# Patient Record
Sex: Male | Born: 1940 | Race: White | Hispanic: No | Smoking: Former smoker
Health system: Southern US, Community
[De-identification: ages and names within clinical notes are randomized; demographics above are authoritative.]

## PROBLEM LIST (undated history)

## (undated) DIAGNOSIS — E7800 Pure hypercholesterolemia, unspecified: Secondary | ICD-10-CM

## (undated) DIAGNOSIS — I10 Essential (primary) hypertension: Secondary | ICD-10-CM

## (undated) DIAGNOSIS — J302 Other seasonal allergic rhinitis: Secondary | ICD-10-CM

## (undated) DIAGNOSIS — G4730 Sleep apnea, unspecified: Secondary | ICD-10-CM

## (undated) DIAGNOSIS — J189 Pneumonia, unspecified organism: Secondary | ICD-10-CM

## (undated) DIAGNOSIS — J45909 Unspecified asthma, uncomplicated: Secondary | ICD-10-CM

## (undated) DIAGNOSIS — R0602 Shortness of breath: Secondary | ICD-10-CM

## (undated) HISTORY — DX: Unspecified asthma, uncomplicated: J45.909

## (undated) HISTORY — DX: Shortness of breath: R06.02

## (undated) HISTORY — DX: Pure hypercholesterolemia, unspecified: E78.00

## (undated) HISTORY — DX: Other seasonal allergic rhinitis: J30.2

## (undated) HISTORY — DX: Essential (primary) hypertension: I10

---

## 1975-10-06 DIAGNOSIS — J189 Pneumonia, unspecified organism: Secondary | ICD-10-CM

## 1975-10-06 HISTORY — DX: Pneumonia, unspecified organism: J18.9

## 2004-12-13 ENCOUNTER — Inpatient Hospital Stay: Payer: Self-pay | Admitting: Anesthesiology

## 2005-01-23 ENCOUNTER — Ambulatory Visit: Payer: Self-pay | Admitting: Internal Medicine

## 2005-10-05 HISTORY — PX: SKIN CANCER EXCISION: SHX779

## 2008-07-02 ENCOUNTER — Ambulatory Visit (HOSPITAL_BASED_OUTPATIENT_CLINIC_OR_DEPARTMENT_OTHER): Admission: RE | Admit: 2008-07-02 | Discharge: 2008-07-02 | Payer: Self-pay | Admitting: Specialist

## 2008-07-02 ENCOUNTER — Encounter (INDEPENDENT_AMBULATORY_CARE_PROVIDER_SITE_OTHER): Payer: Self-pay | Admitting: Specialist

## 2009-01-17 ENCOUNTER — Encounter: Admission: RE | Admit: 2009-01-17 | Discharge: 2009-01-17 | Payer: Self-pay | Admitting: Family Medicine

## 2011-02-17 NOTE — Op Note (Signed)
NAMEWENZEL, BACKLUND               ACCOUNT NO.:  [id number]   MEDICAL RECORD NO.:  [id number]          PATIENT TYPE:  AMB   LOCATION:  DSC                          FACILITY:  [facility]   PHYSICIAN:  Earvin Hansen L. Truesdale, M.D.DATE OF BIRTH:  1941/04/22   DATE OF PROCEDURE:  07/02/2008  DATE OF DISCHARGE:                               OPERATIVE REPORT   INDICATIONS FOR PROCEDURE:  This is a 70 year old gentleman who with  previous history of squamous cell carcinoma and basal cell carcinoma of  the body has a nonhealing area involving the right parietal scalp area  measuring approximately 1 x 1.5 cm.  Previous biopsies showed basal cell  carcinoma because of the fusiform appearance.  We are going to do wide  excision of the area and reconstruct with full-thickness skin graft  taken from the right groin.   ANESTHESIA:  Local 1.5% with epinephrine 1:200,000 concentration, a  total of 20 mL for the right scalp and the right groin.   PROCEDURE IN DETAIL:  Prep was done with Zephiran solution and Betadine,  walled off with sterile towels and drapes so as to make a sterile field.  Previous marks had been made for the margins.  Excision was made  clearing the margins down to underlying deep subcutaneous tissue on the  right scalp.  Subcutaneous bleeding was controlled with the Bovie  anticoagulation.  After proper hemostasis, saline gauze was placed.  Next, a full-thickness skin graft was taken from the right groin crease  line in an elliptical appearance down to the underlying deep  subcutaneous tissue.  Hemostasis was maintained with the Bovie  anticoagulation.  Next, the wound was closed in layers with 3-0 Monocryl  deep subcutaneously x2 and then a running subcuticular stitch of 3-0  Monocryl.  Steri-Strips and soft dressings were applied on the areas.  Next, the full-thickness skin graft was defatted and placed on the  defect and sutured with 4-0 Prolene running.  A run suture was left long  to make that a straight dressing and tied over with cotton and Xeroform  gauze and Ace wrap.  He tolerated all the procedures very well and was  taken to recovery in good condition.      Yaakov Guthrie. Shon Hough, M.D.  Electronically Signed     GLT/MEDQ  D:  07/02/2008  T:  07/02/2008  Job:  [id number]

## 2014-03-01 ENCOUNTER — Encounter (INDEPENDENT_AMBULATORY_CARE_PROVIDER_SITE_OTHER): Payer: Self-pay

## 2014-03-01 ENCOUNTER — Ambulatory Visit (INDEPENDENT_AMBULATORY_CARE_PROVIDER_SITE_OTHER): Payer: Medicare Other | Admitting: Internal Medicine

## 2014-03-01 ENCOUNTER — Encounter: Payer: Self-pay | Admitting: Internal Medicine

## 2014-03-01 VITALS — BP 102/68 | HR 86 | Ht 69.0 in | Wt 349.0 lb

## 2014-03-01 DIAGNOSIS — J441 Chronic obstructive pulmonary disease with (acute) exacerbation: Secondary | ICD-10-CM

## 2014-03-01 DIAGNOSIS — J449 Chronic obstructive pulmonary disease, unspecified: Secondary | ICD-10-CM

## 2014-03-01 DIAGNOSIS — J45901 Unspecified asthma with (acute) exacerbation: Secondary | ICD-10-CM

## 2014-03-01 DIAGNOSIS — G4733 Obstructive sleep apnea (adult) (pediatric): Secondary | ICD-10-CM

## 2014-03-01 DIAGNOSIS — J209 Acute bronchitis, unspecified: Secondary | ICD-10-CM

## 2014-03-01 MED ORDER — MOMETASONE FURO-FORMOTEROL FUM 200-5 MCG/ACT IN AERO: 2.0000 | INHALATION_SPRAY | Freq: Two times a day (BID) | RESPIRATORY_TRACT | Status: DC

## 2014-03-01 NOTE — Progress Notes (Signed)
03/01/14- 73 yoM former smoker(2PPD x 30 yrs) Referred by Dr. Sigmund Hazel for wheezing.   He reports a diagnosis of asthmatic bronchitis made in January of 2015 but says he has been wheezing for many years. Wheezing is perennial and aggravated by walking. His current exacerbation was triggered by a viral pattern bronchitis. He has been treated with 2 rounds of prednisone, Symbicort and a nebulizer with Perforomist and budesonide. He is scheduled to start a third round of prednisone. He complains of frequent wheeze and cough which is productive of thick white foamy or yellow sputum. Today for the first time he had a streak of blood with very hard coughing. He denies heart disease but is treated for hypertension. Rare reflux. Hospitalized with pneumonia in 1977. Diagnosed with sleep apnea but finds CPAP hard to use-can't keep mask on. CXR report 02/01/14- "underlying emphysematous change. No edema or consolidation". In the past, Theodur and codeine have caused visual hallucinations and dextromethorphan caused "hangover".  Denies glaucoma or prostatism. No problem with aspirin. Some nasal congestion blamed on allergy. 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  Prior to Admission medications   Medication Sig Start Date End Date Taking? Authorizing Provider  albuterol (PROVENTIL HFA;VENTOLIN HFA) 108 (90 BASE) MCG/ACT inhaler Inhale 1-2 puffs into the lungs every 6 (six) hours as needed for wheezing or shortness of breath.   Yes Historical Provider, MD  budesonide-formoterol (SYMBICORT) 80-4.5 MCG/ACT inhaler Inhale 2 puffs into the lungs 2 (two) times daily.   Yes Historical Provider, MD  lisinopril (PRINIVIL,ZESTRIL) 30 MG tablet Take 30 mg by mouth daily.   Yes Historical Provider, MD  mometasone-formoterol (DULERA) 200-5 MCG/ACT AERO Inhale 2 puffs into the lungs 2 (two) times daily. 03/01/14   Waymon Budge, MD   Past Medical History  Diagnosis Date  . SOB (shortness of breath)   .  Hypertension   . Asthma   . High cholesterol   . Seasonal allergies    Past Surgical History  Procedure Laterality Date  . Skin cancer excision  2007    scalp   History reviewed. No pertinent family history. History   Social History  . Marital Status: Legally Separated    Spouse Name: N/A    Number of Children: N/A  . Years of Education: N/A   Occupational History  . retired    Social History Main Topics  . Smoking status: Former Smoker -- 2.00 packs/day for 15 years    Types: Cigarettes    Quit date: 10/06/1975  . Smokeless tobacco: Never Used  . Alcohol Use: No  . Drug Use: No  . Sexual Activity: Not on file   Other Topics Concern  . Not on file   Social History Narrative  . No narrative on file   ROS-see HPI Constitutional:   No-   weight loss, night sweats, fevers, chills, fatigue, lassitude. HEENT:   No-  headaches, difficulty swallowing, tooth/dental problems, sore throat,       No-  sneezing, itching, ear ache, +nasal congestion, post nasal drip,  CV:  No-   chest pain, orthopnea, PND, swelling in lower extremities, anasarca,                                  dizziness, palpitations Resp: +shortness of breath with exertion or at rest.              +  productive  cough,  No non-productive cough,  No- coughing up of blood.              +change in color of mucus.  + wheezing.   Skin: No-   rash or lesions. GI: + heartburn, indigestion, no-abdominal pain, nausea, vomiting, diarrhea,                 change in bowel habits, loss of appetite GU: No-   dysuria, change in color of urine, no urgency or frequency.  No- flank pain. MS:  No-   joint pain or swelling.  No- decreased range of motion.  No- back pain. Neuro-     nothing unusual Psych:  No- change in mood or affect. No depression or anxiety.  No memory loss.  OBJ- Physical Exam   General- Alert, Oriented, Affect-appropriate, Distress- none acute. + morbidly obese, wheelchair Skin- rash-none, lesions- none,  excoriation- none Lymphadenopathy- none Head- atraumatic            Eyes- Gross vision intact, PERRLA, conjunctivae and secretions clear            Ears- Hearing, canals-normal            Nose- Clear, no-Septal dev, mucus, polyps, erosion, perforation             Throat- Mallampati IV , mucosa clear , drainage- none, tonsils- atrophic Neck- flexible , trachea midline, no stridor , thyroid nl, carotid no bruit Chest - symmetrical excursion , unlabored           Heart/CV- RRR , no murmur , no gallop  , no rub, nl s1 s2                           - JVD- none , edema- none, stasis changes+, varices- none           Lung- clear to P&A, wheeze-+bilateral, unlabored, cough- none , dullness-none, rub- none           Chest wall-  Abd- tender-no, distended-no, bowel sounds-present, HSM- no Br/ Gen/ Rectal- Not done, not indicated Extrem- cyanosis- none, clubbing, none, atrophy- none, strength- nl. Neg Homan's Neuro- grossly intact to observation

## 2014-03-01 NOTE — Patient Instructions (Signed)
Try off Lisinopril for one month Check your BP daily. If it goes over 140/80, please call us and we will send you a prescription for Benicar 20 mg to take to complete a month off Lisinopril while we see if there is any improvement in your cough/ wheeze.  Sample Dulera 200    2 puffs then rinse mouth twice daily- maintenance inhaler     You can still use your nebulizer and rescue inhaler as before  Order- Schedule PFT  Dx  COPD with asthma  Please call as needed

## 2014-03-03 ENCOUNTER — Encounter: Payer: Self-pay | Admitting: Internal Medicine

## 2014-03-04 DIAGNOSIS — J441 Chronic obstructive pulmonary disease with (acute) exacerbation: Secondary | ICD-10-CM | POA: Insufficient documentation

## 2014-03-04 DIAGNOSIS — G4733 Obstructive sleep apnea (adult) (pediatric): Secondary | ICD-10-CM | POA: Insufficient documentation

## 2014-03-04 NOTE — Assessment & Plan Note (Signed)
-   Weight loss is recommended-His body habitus suggests that dyspnea on exertion does not have to come from lung disease

## 2014-03-04 NOTE — Assessment & Plan Note (Signed)
With a 60-pack-year smoking history, COPD is probable. He has had an exacerbation since what sounds like a viral illness during the winter. He has been on reasonable medications including rounds of prednisone. We need to consider the possibility that his lisinopril/ACE inhibitor may be contributing to the cough. His blood pressure is on the low side today, so I think he can tolerate a trial off of lisinopril. He can check his own pressure at home, sore throat gets too high, he will call about getting started on an ARB for a month so we can see if there is a response. Plan-sample Dulera for trial, try off of lisinopril, schedule PFT, reflux precautions

## 2014-03-04 NOTE — Assessment & Plan Note (Signed)
He describes poor compliance with CPAP because he is restless in his sleep and does not keep the mask on. This is not the reason for today's examination. If my help is needed, can be discussed at another visit, but I have asked him to contact his managing physician for this problem.

## 2014-03-05 NOTE — Telephone Encounter (Signed)
5.30.15 mychart email from pt: Dr Maple Hudson  I have purchased two digital B/P kits and neither of them work properly. They gave me readings like  160/80 to 196/96 I know these readings can not be right. Can you let me know where I can find one like you use in the office.   Thank You  Lelon Mast [email or web address]   Pt seen 5.28.15 by CDY for pulmonary consult: Patient Instructions     Try off Lisinopril for one month  Check your BP daily. If it goes over 140/80, please call us and we will send you a prescription for Benicar 20 mg to take to complete a month off Lisinopril while we see if there is any improvement in your cough/ wheeze.  Sample Dulera 200 2 puffs then rinse mouth twice daily- maintenance inhaler You can still use your nebulizer and rescue inhaler as before  Order- Schedule PFT Dx COPD with asthma  Please call as needed   Asked pt about factors that can affect the automatic BP machines >> cuff too small (pt weighs >300lb), holding arm at correct level, ambient noises, old batteries in the machine.  Also informed pt where he can find a manual cuff but will need help from someone who knows how to check manual BP's.  Will forward to CY as FYI about the BP readings.

## 2014-04-13 ENCOUNTER — Ambulatory Visit (INDEPENDENT_AMBULATORY_CARE_PROVIDER_SITE_OTHER): Payer: Medicare Other | Admitting: Internal Medicine

## 2014-04-13 ENCOUNTER — Telehealth: Payer: Self-pay | Admitting: Internal Medicine

## 2014-04-13 ENCOUNTER — Encounter: Payer: Self-pay | Admitting: Internal Medicine

## 2014-04-13 VITALS — BP 126/80 | HR 80 | Ht 69.0 in | Wt 368.0 lb

## 2014-04-13 DIAGNOSIS — J209 Acute bronchitis, unspecified: Secondary | ICD-10-CM

## 2014-04-13 DIAGNOSIS — G4733 Obstructive sleep apnea (adult) (pediatric): Secondary | ICD-10-CM

## 2014-04-13 DIAGNOSIS — J441 Chronic obstructive pulmonary disease with (acute) exacerbation: Secondary | ICD-10-CM

## 2014-04-13 DIAGNOSIS — J45901 Unspecified asthma with (acute) exacerbation: Secondary | ICD-10-CM

## 2014-04-13 DIAGNOSIS — J449 Chronic obstructive pulmonary disease, unspecified: Secondary | ICD-10-CM

## 2014-04-13 LAB — PULMONARY FUNCTION TEST
DL/VA % PRED: 100 %
DL/VA: 4.56 ml/min/mmHg/L
DLCO UNC: 22.71 ml/min/mmHg
DLCO unc % pred: 73 %
FEF 25-75 PRE: 1.79 L/s
FEF 25-75 Post: 1.92 L/sec
FEF2575-%Change-Post: 6 %
FEF2575-%PRED-PRE: 80 %
FEF2575-%Pred-Post: 86 %
FEV1-%Change-Post: 2 %
FEV1-%Pred-Post: 81 %
FEV1-%Pred-Pre: 79 %
FEV1-Post: 2.46 L
FEV1-Pre: 2.39 L
FEV1FVC-%CHANGE-POST: 1 %
FEV1FVC-%Pred-Pre: 102 %
FEV6-%CHANGE-POST: 1 %
FEV6-%PRED-POST: 82 %
FEV6-%PRED-PRE: 81 %
FEV6-PRE: 3.18 L
FEV6-Post: 3.22 L
FEV6FVC-%Change-Post: 0 %
FEV6FVC-%PRED-POST: 106 %
FEV6FVC-%Pred-Pre: 106 %
FVC-%Change-Post: 1 %
FVC-%PRED-POST: 77 %
FVC-%Pred-Pre: 76 %
FVC-POST: 3.22 L
FVC-PRE: 3.18 L
POST FEV6/FVC RATIO: 100 %
Post FEV1/FVC ratio: 76 %
Pre FEV1/FVC ratio: 75 %
Pre FEV6/FVC Ratio: 100 %
RV % pred: 82 %
RV: 2.03 L
TLC % PRED: 77 %
TLC: 5.32 L

## 2014-04-13 MED ORDER — ALBUTEROL SULFATE HFA 108 (90 BASE) MCG/ACT IN AERS: INHALATION_SPRAY | RESPIRATORY_TRACT | Status: AC

## 2014-04-13 NOTE — Patient Instructions (Addendum)
Ok to continue present meds.  Please call as needed  Script sent refilling rescue inhaler

## 2014-04-13 NOTE — Assessment & Plan Note (Signed)
Weight loss counseled. He did not accept offer for medical weight loss referral at this time.

## 2014-04-13 NOTE — Progress Notes (Signed)
PFT done today. 

## 2014-04-13 NOTE — Telephone Encounter (Signed)
After OV today with CY, pt had questions if he should resume lisinopril. I spoke with CY: Per CY, if pt has some, he can restart it, but watch to see if cough worsens.  In the long run, he would like to have pt on something else other than lisinopril.  Pt should talk with PCP regarding this change.  I have lmomtcb to inform pt of above per CY.

## 2014-04-13 NOTE — Progress Notes (Signed)
03/01/14- 73 yoM former smoker(2PPD x 30 yrs) Referred by Dr. Sigmund Hazel for wheezing.   He reports a diagnosis of asthmatic bronchitis made in January of 2015 but says he has been wheezing for many years. Wheezing is perennial and aggravated by walking. His current exacerbation was triggered by a viral pattern bronchitis. He has been treated with 2 rounds of prednisone, Symbicort and a nebulizer with Perforomist and budesonide. He is scheduled to start a third round of prednisone. He complains of frequent wheeze and cough which is productive of thick white foamy or yellow sputum. Today for the first time he had a streak of blood with very hard coughing. He denies heart disease but is treated for hypertension. Rare reflux. Hospitalized with pneumonia in 1977. Diagnosed with sleep apnea but finds CPAP hard to use-can't keep mask on. CXR report 02/01/14- "underlying emphysematous change. No edema or consolidation". In the past, Theodur and codeine have caused visual hallucinations and dextromethorphan caused "hangover".  Denies glaucoma or prostatism. No problem with aspirin. Some nasal congestion blamed on allergy. 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  04/13/14- 73 yoM former smoker(2PPD x 30 yrs) followed for Asthma with COPD, complicated by morbid obesity, OSA FOLLOWS FOR: review PFT with patient Feeling much better with no recent major wheeze. Sleeping better with less cough. Nebulizer has been a significant help. Off Symbicort because it duplicates the steroid in his budesonide nebulizer. Says he is "trying to diet". PFT 04/13/2014-very slight obstructive airways disease with insignificant response to bronchodilator. Diffusion slightly reduced. FVC 3.22/77%, FEV1 2.46/81%, FEV1/FVC 0.76, TLC 77%, DLCO 73%.  ROS-see HPI Constitutional:   No-   weight loss, night sweats, fevers, chills, fatigue, lassitude. HEENT:   No-  headaches, difficulty swallowing, tooth/dental problems, sore  throat,       No-  sneezing, itching, ear ache, no-nasal congestion, post nasal drip,  CV:  No-   chest pain, orthopnea, PND, swelling in lower extremities, anasarca,                                  dizziness, palpitations Resp: +shortness of breath with exertion or at rest.            No-productive cough,  No non-productive cough,  No- coughing up of blood.              No-change in color of mucus.  + wheezing.   Skin: No-   rash or lesions. GI: + heartburn, indigestion, no-abdominal pain, nausea,  GU:  MS:  No-   joint pain or swelling.   Neuro-     nothing unusual Psych:  No- change in mood or affect. No depression or anxiety.  No memory loss.  OBJ- Physical Exam   General- Alert, Oriented, Affect-appropriate, Distress- none acute. + morbidly obese, wheelchair Skin- rash-none, lesions- none, excoriation- none Lymphadenopathy- none Head- atraumatic            Eyes- Gross vision intact, PERRLA, conjunctivae and secretions clear            Ears- Hearing, canals-normal            Nose- Clear, no-Septal dev, mucus, polyps, erosion, perforation             Throat- Mallampati IV , mucosa clear , drainage- none, tonsils- atrophic Neck- flexible , trachea midline, no stridor , thyroid nl, carotid no bruit Chest - symmetrical excursion ,  unlabored           Heart/CV- RRR , no murmur , no gallop  , no rub, nl s1 s2                           - JVD- none , edema- none, stasis changes+, varices- none           Lung- clear to P&A, wheeze-+bilateral, unlabored, cough- none , dullness-none, rub- none           Chest wall-  Abd-  Br/ Gen/ Rectal- Not done, not indicated Extrem- cyanosis- none, clubbing, none, atrophy- none, strength- nl.  Neuro- grossly intact to observation

## 2014-04-13 NOTE — Assessment & Plan Note (Signed)
He had smoked heavily and chest x-ray interpretation suggests some emphysema not apparent on his PFTs. He is likely now at baseline with reactive component suppressed. Plan-no change to offer

## 2014-04-13 NOTE — Assessment & Plan Note (Signed)
He may decide he wants us to revisit this issue

## 2014-04-13 NOTE — Telephone Encounter (Signed)
Pt returned call; patient aware to restart Lisinopril since he has some left;pt will watch for cough and speak with his PCP if worsens.

## 2014-08-14 ENCOUNTER — Ambulatory Visit (INDEPENDENT_AMBULATORY_CARE_PROVIDER_SITE_OTHER): Payer: Medicare Other | Admitting: Internal Medicine

## 2014-08-14 ENCOUNTER — Encounter: Payer: Self-pay | Admitting: Internal Medicine

## 2014-08-14 VITALS — BP 148/80 | HR 95 | Ht 69.0 in | Wt 349.0 lb

## 2014-08-14 DIAGNOSIS — J449 Chronic obstructive pulmonary disease, unspecified: Secondary | ICD-10-CM

## 2014-08-14 DIAGNOSIS — J441 Chronic obstructive pulmonary disease with (acute) exacerbation: Secondary | ICD-10-CM

## 2014-08-14 DIAGNOSIS — G4733 Obstructive sleep apnea (adult) (pediatric): Secondary | ICD-10-CM

## 2014-08-14 NOTE — Patient Instructions (Signed)
We can continue current respiratory meds  Please call as needed

## 2014-08-14 NOTE — Progress Notes (Signed)
03/01/14- 73 yoM former smoker(2PPD x 30 yrs) Referred by Dr. Sigmund HazelLisa Miller for wheezing.   He reports a diagnosis of asthmatic bronchitis made in January of 2015 but says he has been wheezing for many years. Wheezing is perennial and aggravated by walking. His current exacerbation was triggered by a viral pattern bronchitis. He has been treated with 2 rounds of prednisone, Symbicort and a nebulizer with Perforomist and budesonide. He is scheduled to start a third round of prednisone. He complains of frequent wheeze and cough which is productive of thick white foamy or yellow sputum. Today for the first time he had a streak of blood with very hard coughing. He denies heart disease but is treated for hypertension. Rare reflux. Hospitalized with pneumonia in 1977. Diagnosed with sleep apnea but finds CPAP hard to use-can't keep mask on. CXR report 02/01/14- "underlying emphysematous change. No edema or consolidation". In the past, Theodur and codeine have caused visual hallucinations and dextromethorphan caused "hangover".  Denies glaucoma or prostatism. No problem with aspirin. Some nasal congestion blamed on allergy. 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  04/13/14- 73 yoM former smoker(2PPD x 30 yrs) followed for Asthma with COPD, complicated by morbid obesity, OSA FOLLOWS FOR: review PFT with patient Feeling much better with no recent major wheeze. Sleeping better with less cough. Nebulizer has been a significant help. Off Symbicort because it duplicates the steroid in his budesonide nebulizer. Says he is "trying to diet". PFT 04/13/2014-very slight obstructive airways disease with insignificant response to bronchodilator. Diffusion slightly reduced. FVC 3.22/77%, FEV1 2.46/81%, FEV1/FVC 0.76, TLC 77%, DLCO 73%.  08/14/14- 73 yoM former smoker(2PPD x 30 yrs) followed for Asthma with COPD, complicated by morbid obesity, OSA FOLLOWS FOR: patient has some sob with exertion, some cough, Patient  denies cough and wheezing. Had flu shot Using nebs- pulmicort and Perforomist twice daily. Neb treatments help him break up the mucus so he coughs himself clear afterwards. Admits easy dyspnea on exertion trying to keep up with his lady friend shopping. He will need new sleep study to qualify for replacement supplies.  ROS-see HPI Constitutional:   No-   weight loss, night sweats, fevers, chills, fatigue, lassitude. HEENT:   No-  headaches, difficulty swallowing, tooth/dental problems, sore throat,       No-  sneezing, itching, ear ache, no-nasal congestion, post nasal drip,  CV:  No-   chest pain, orthopnea, PND, swelling in lower extremities, anasarca,                                  dizziness, palpitations Resp: +shortness of breath with exertion or at rest.            +productive cough,  No non-productive cough,  No- coughing up of blood.              No-change in color of mucus.  + wheezing.   Skin: No-   rash or lesions. GI: + heartburn, indigestion, no-abdominal pain, nausea,  GU:  MS:  No-   joint pain or swelling.   Neuro-     nothing unusual Psych:  No- change in mood or affect. No depression or anxiety.  No memory loss.  OBJ- Physical Exam   General- Alert, Oriented, Affect-appropriate, Distress- none acute. + morbidly               obese, wheelchair Skin- rash-none, lesions- none, excoriation- none  Lymphadenopathy- none Head- atraumatic            Eyes- Gross vision intact, PERRLA, conjunctivae and secretions clear            Ears- Hearing, canals-normal            Nose- Clear, no-Septal dev, mucus, polyps, erosion, perforation             Throat- Mallampati IV , mucosa clear , drainage- none, tonsils- atrophic Neck- flexible , trachea midline, no stridor , thyroid nl, carotid no bruit Chest - symmetrical excursion , unlabored           Heart/CV- RRR , no murmur , no gallop  , no rub, nl s1 s2                           - JVD- none , edema- none, stasis changes+, varices-  none           Lung- clear to P&A, wheeze-+bilateral, unlabored, cough- none , dullness-none, rub- none           Chest wall-  Abd-  Br/ Gen/ Rectal- Not done, not indicated Extrem- cyanosis- none, clubbing, none, atrophy- none, strength- nl.  Neuro- grossly intact to observation

## 2014-08-18 NOTE — Assessment & Plan Note (Signed)
Needs maintenance neb treatments, but that seems sufficient

## 2014-08-18 NOTE — Assessment & Plan Note (Signed)
We are ordering an update split protocol sleep study to re-qualify him for his CPAP.

## 2014-08-18 NOTE — Assessment & Plan Note (Signed)
No effort to lose weight. This dominates his health problems. I doubt he would be considered for bariatric surgery.

## 2014-08-24 ENCOUNTER — Encounter (HOSPITAL_COMMUNITY): Payer: Self-pay | Admitting: *Deleted

## 2014-09-12 ENCOUNTER — Other Ambulatory Visit: Payer: Self-pay | Admitting: Gastroenterology

## 2014-09-12 NOTE — Addendum Note (Signed)
Addended by: Charlott RakesSCHOOLER, Lisandro Meggett on: 09/12/2014 04:17 PM   Modules accepted: Orders

## 2014-09-12 NOTE — Anesthesia Preprocedure Evaluation (Signed)
Anesthesia Evaluation  Patient identified by MRN, date of birth, ID band Patient awake    Reviewed: Allergy & Precautions, H&P , NPO status , Patient's Chart, lab work & pertinent test results  History of Anesthesia Complications Negative for: history of anesthetic complications  Airway Mallampati: III  TM Distance: >3 FB Neck ROM: Full    Dental no notable dental hx. (+) Dental Advisory Given   Pulmonary shortness of breath and with exertion, asthma , sleep apnea and Continuous Positive Airway Pressure Ventilation , COPD COPD inhaler, former smoker,  breath sounds clear to auscultation  Pulmonary exam normal       Cardiovascular hypertension, Pt. on medications Rhythm:Regular Rate:Normal     Neuro/Psych negative neurological ROS  negative psych ROS   GI/Hepatic negative GI ROS, Neg liver ROS,   Endo/Other  Morbid obesity  Renal/GU negative Renal ROS  negative genitourinary   Musculoskeletal negative musculoskeletal ROS (+)   Abdominal (+) + obese,   Peds negative pediatric ROS (+)  Hematology negative hematology ROS (+)   Anesthesia Other Findings   Reproductive/Obstetrics negative OB ROS                             Anesthesia Physical Anesthesia Plan  ASA: III  Anesthesia Plan: MAC   Post-op Pain Management:    Induction: Intravenous  Airway Management Planned: Nasal Cannula  Additional Equipment:   Intra-op Plan:   Post-operative Plan: Extubation in OR  Informed Consent: I have reviewed the patients History and Physical, chart, labs and discussed the procedure including the risks, benefits and alternatives for the proposed anesthesia with the patient or authorized representative who has indicated his/her understanding and acceptance.   Dental advisory given  Plan Discussed with: CRNA  Anesthesia Plan Comments:         Anesthesia Quick Evaluation

## 2014-09-13 ENCOUNTER — Ambulatory Visit (HOSPITAL_COMMUNITY)
Admission: RE | Admit: 2014-09-13 | Discharge: 2014-09-13 | Disposition: A | Discharge status: Home / Self Care | Payer: Medicare Other | Source: Ambulatory Visit | Attending: Gastroenterology | Admitting: Gastroenterology

## 2014-09-13 ENCOUNTER — Encounter (HOSPITAL_COMMUNITY)
Admission: RE | Disposition: A | Discharge status: Home / Self Care | Payer: Self-pay | Source: Ambulatory Visit | Attending: Gastroenterology

## 2014-09-13 ENCOUNTER — Ambulatory Visit (HOSPITAL_COMMUNITY): Payer: Medicare Other | Admitting: Anesthesiology

## 2014-09-13 ENCOUNTER — Encounter (HOSPITAL_COMMUNITY): Payer: Self-pay | Admitting: *Deleted

## 2014-09-13 DIAGNOSIS — Z6841 Body Mass Index (BMI) 40.0 and over, adult: Secondary | ICD-10-CM | POA: Diagnosis not present

## 2014-09-13 DIAGNOSIS — Z86010 Personal history of colonic polyps: Secondary | ICD-10-CM | POA: Diagnosis not present

## 2014-09-13 DIAGNOSIS — J45909 Unspecified asthma, uncomplicated: Secondary | ICD-10-CM | POA: Insufficient documentation

## 2014-09-13 DIAGNOSIS — Z87891 Personal history of nicotine dependence: Secondary | ICD-10-CM | POA: Insufficient documentation

## 2014-09-13 DIAGNOSIS — K644 Residual hemorrhoidal skin tags: Secondary | ICD-10-CM | POA: Insufficient documentation

## 2014-09-13 DIAGNOSIS — Z09 Encounter for follow-up examination after completed treatment for conditions other than malignant neoplasm: Secondary | ICD-10-CM | POA: Diagnosis present

## 2014-09-13 DIAGNOSIS — J449 Chronic obstructive pulmonary disease, unspecified: Secondary | ICD-10-CM | POA: Insufficient documentation

## 2014-09-13 DIAGNOSIS — K648 Other hemorrhoids: Secondary | ICD-10-CM | POA: Insufficient documentation

## 2014-09-13 HISTORY — PX: COLONOSCOPY WITH PROPOFOL: SHX5780

## 2014-09-13 HISTORY — DX: Sleep apnea, unspecified: G47.30

## 2014-09-13 HISTORY — DX: Pneumonia, unspecified organism: J18.9

## 2014-09-13 SURGERY — COLONOSCOPY WITH PROPOFOL
Anesthesia: Monitor Anesthesia Care

## 2014-09-13 MED ORDER — PROPOFOL INFUSION 10 MG/ML OPTIME
INTRAVENOUS | Status: DC | PRN
  Administered 2014-09-13: 200 ug/kg/min via INTRAVENOUS

## 2014-09-13 MED ORDER — PROPOFOL 10 MG/ML IV BOLUS
INTRAVENOUS | Status: AC
  Filled 2014-09-13: qty 20

## 2014-09-13 MED ORDER — LACTATED RINGERS IV SOLN
INTRAVENOUS | Status: DC | PRN
  Administered 2014-09-13: 10:00:00 via INTRAVENOUS

## 2014-09-13 MED ORDER — GLYCOPYRROLATE 0.2 MG/ML IJ SOLN
INTRAMUSCULAR | Status: DC | PRN
  Administered 2014-09-13: 0.2 mg via INTRAVENOUS

## 2014-09-13 MED ORDER — LIDOCAINE HCL (CARDIAC) 20 MG/ML IV SOLN
INTRAVENOUS | Status: DC | PRN
  Administered 2014-09-13: 100 mg via INTRAVENOUS

## 2014-09-13 SURGICAL SUPPLY — 21 items

## 2014-09-13 NOTE — H&P (Signed)
  Date of Initial H&P: 09/11/14  History reviewed, patient examined, no change in status, stable for surgery. 

## 2014-09-13 NOTE — Discharge Instructions (Signed)
Repeat colonoscopy in 5 years due to history of colon polyps.

## 2014-09-13 NOTE — Interval H&P Note (Signed)
History and Physical Interval Note:  09/13/2014 9:35 AM  Andres Marshall  has presented today for surgery, with the diagnosis of hx of polyps  The various methods of treatment have been discussed with the patient and family. After consideration of risks, benefits and other options for treatment, the patient has consented to  Procedure(s): COLONOSCOPY WITH PROPOFOL (N/A) as a surgical intervention .  The patient's history has been reviewed, patient examined, no change in status, stable for surgery.  I have reviewed the patient's chart and labs.  Questions were answered to the patient's satisfaction.     Rollo Farquhar C.

## 2014-09-13 NOTE — Transfer of Care (Signed)
Immediate Anesthesia Transfer of Care Note  Patient: Andres Marshall  Procedure(s) Performed: Procedure(s): COLONOSCOPY WITH PROPOFOL (N/A)  Patient Location: PACU  Anesthesia Type:MAC  Level of Consciousness: awake, alert , oriented and patient cooperative  Airway & Oxygen Therapy: Patient Spontanous Breathing and Patient connected to face mask oxygen  Post-op Assessment: Report given to PACU RN, Post -op Vital signs reviewed and stable and Patient moving all extremities X 4  Post vital signs: stable  Complications: No apparent anesthesia complications

## 2014-09-13 NOTE — Op Note (Signed)
[facility] [address] [place], [id number]   COLONOSCOPY PROCEDURE REPORT     EXAM DATE: 09/13/2014  PATIENT NAME:      Andres Marshall, Andres Marshall           MR #:      [id number]  BIRTHDATE:       1941-04-15      VISIT #:     [id number]  ATTENDING:     Charlott RakesVincent Genevie Elman, MD     STATUS:     outpatient REFERRING MD: ASA CLASS:        Class III  INDICATIONS:  The patient is a 73 yr old male here for a colonoscopy due to history of adenomatous polyps. PROCEDURE PERFORMED: MEDICATIONS:     Per Anesthesia and Monitored anesthesia care  ESTIMATED BLOOD LOSS:     None  CONSENT: The patient understands the risks and benefits of the procedure and understands that these risks include, but are not limited to: sedation, allergic reaction, infection, perforation and/or bleeding. Alternative means of evaluation and treatment include, among others: physical exam, x-rays, and/or surgical intervention. The patient elects to proceed with this endoscopic procedure.  DESCRIPTION OF PROCEDURE: During intra-op preparation period all mechanical & medical equipment was checked for proper function. Hand hygiene and appropriate measures for infection prevention was taken. After the risks, benefits and alternatives of the procedure were thoroughly explained, Informed consent was verified, confirmed and timeout was successfully executed by the treatment team. A digital exam revealed external non-thrombosed hemorrhoids with a small amount of blood seen at the anus.      The Pentax Ped Colon [id number] endoscope was introduced through the anus and advanced to the cecum, which was identified by both the appendix and ileocecal valve. The terminal ileum was intubated and was normal in appearance. The prep was fair but repeated irrigation and aspiration led to a good and adequate prep. The instrument was then slowly withdrawn as the colon was fully examined. No mucosal abnormalities were seen  in the colon      Retroflexion revealed small internal hemorrhoids.       The scope was then completely withdrawn from the patient and the procedure terminated.      ADVERSE EVENTS:      There were no immediate complications.   IMPRESSIONS:     Medium-sized external hemorrhoids and small internal hemorrhoids  RECOMMENDATIONS:     Repeat colonoscopy in 5 years; Refer to surgery for further management of hemorrhoids   Charlott RakesVincent Tysin Salada, MD eSigned:  Charlott RakesVincent Yovana Scogin, MD 09/13/2014 11:22 AM   cc:  CPT CODES: ICD CODES:  The ICD and CPT codes recommended by this software are interpretations from the data that the clinical staff has captured with the software.  The verification of the translation of this report to the ICD and CPT codes and modifiers is the sole responsibility of the health care institution and practicing physician where this report was generated.  [employer] will not be held responsible for the validity of the ICD and CPT codes included on this report.  AMA assumes no liability for data contained or not contained herein. CPT is a Publishing rights managerregistered trademark of the [employer].

## 2014-09-13 NOTE — Anesthesia Postprocedure Evaluation (Signed)
  Anesthesia Post-op Note  Patient: Andres Marshall  Procedure(s) Performed: Procedure(s) (LRB): COLONOSCOPY WITH PROPOFOL (N/A)  Patient Location: PACU  Anesthesia Type: MAC  Level of Consciousness: awake and alert   Airway and Oxygen Therapy: Patient Spontanous Breathing  Post-op Pain: mild  Post-op Assessment: Post-op Vital signs reviewed, Patient's Cardiovascular Status Stable, Respiratory Function Stable, Patent Airway and No signs of Nausea or vomiting  Last Vitals:  Filed Vitals:   09/13/14 1200  BP:   Pulse: 78  Temp:   Resp: 13    Post-op Vital Signs: stable   Complications: No apparent anesthesia complications

## 2014-09-14 ENCOUNTER — Encounter (HOSPITAL_COMMUNITY): Payer: Self-pay | Admitting: Gastroenterology

## 2015-01-10 ENCOUNTER — Emergency Department (HOSPITAL_COMMUNITY): Payer: Medicare Other

## 2015-01-10 ENCOUNTER — Encounter (HOSPITAL_COMMUNITY): Payer: Self-pay | Admitting: Emergency Medicine

## 2015-01-10 ENCOUNTER — Inpatient Hospital Stay (HOSPITAL_COMMUNITY)
Admission: EM | Admit: 2015-01-10 | Discharge: 2015-02-03 | DRG: 296 | Disposition: E | Payer: Medicare Other | Attending: Internal Medicine | Admitting: Internal Medicine

## 2015-01-10 ENCOUNTER — Inpatient Hospital Stay (HOSPITAL_COMMUNITY): Payer: Medicare Other

## 2015-01-10 DIAGNOSIS — W1789XA Other fall from one level to another, initial encounter: Secondary | ICD-10-CM | POA: Diagnosis present

## 2015-01-10 DIAGNOSIS — Z85828 Personal history of other malignant neoplasm of skin: Secondary | ICD-10-CM | POA: Diagnosis not present

## 2015-01-10 DIAGNOSIS — I10 Essential (primary) hypertension: Secondary | ICD-10-CM | POA: Diagnosis present

## 2015-01-10 DIAGNOSIS — Z7951 Long term (current) use of inhaled steroids: Secondary | ICD-10-CM

## 2015-01-10 DIAGNOSIS — Z87891 Personal history of nicotine dependence: Secondary | ICD-10-CM

## 2015-01-10 DIAGNOSIS — I469 Cardiac arrest, cause unspecified: Principal | ICD-10-CM | POA: Diagnosis present

## 2015-01-10 DIAGNOSIS — J9601 Acute respiratory failure with hypoxia: Secondary | ICD-10-CM | POA: Diagnosis present

## 2015-01-10 DIAGNOSIS — K7200 Acute and subacute hepatic failure without coma: Secondary | ICD-10-CM | POA: Diagnosis present

## 2015-01-10 DIAGNOSIS — I4891 Unspecified atrial fibrillation: Secondary | ICD-10-CM | POA: Diagnosis present

## 2015-01-10 DIAGNOSIS — E872 Acidosis: Secondary | ICD-10-CM | POA: Diagnosis present

## 2015-01-10 DIAGNOSIS — E780 Pure hypercholesterolemia: Secondary | ICD-10-CM | POA: Diagnosis present

## 2015-01-10 DIAGNOSIS — Z515 Encounter for palliative care: Secondary | ICD-10-CM

## 2015-01-10 DIAGNOSIS — G253 Myoclonus: Secondary | ICD-10-CM | POA: Diagnosis present

## 2015-01-10 DIAGNOSIS — J45909 Unspecified asthma, uncomplicated: Secondary | ICD-10-CM | POA: Diagnosis present

## 2015-01-10 DIAGNOSIS — N179 Acute kidney failure, unspecified: Secondary | ICD-10-CM | POA: Diagnosis present

## 2015-01-10 DIAGNOSIS — Z66 Do not resuscitate: Secondary | ICD-10-CM | POA: Diagnosis present

## 2015-01-10 DIAGNOSIS — J449 Chronic obstructive pulmonary disease, unspecified: Secondary | ICD-10-CM | POA: Diagnosis present

## 2015-01-10 DIAGNOSIS — S90812A Abrasion, left foot, initial encounter: Secondary | ICD-10-CM | POA: Diagnosis present

## 2015-01-10 DIAGNOSIS — R1110 Vomiting, unspecified: Secondary | ICD-10-CM | POA: Diagnosis present

## 2015-01-10 DIAGNOSIS — G4733 Obstructive sleep apnea (adult) (pediatric): Secondary | ICD-10-CM | POA: Diagnosis present

## 2015-01-10 DIAGNOSIS — Z79899 Other long term (current) drug therapy: Secondary | ICD-10-CM

## 2015-01-10 DIAGNOSIS — Z6841 Body Mass Index (BMI) 40.0 and over, adult: Secondary | ICD-10-CM

## 2015-01-10 DIAGNOSIS — G931 Anoxic brain damage, not elsewhere classified: Secondary | ICD-10-CM | POA: Diagnosis present

## 2015-01-10 DIAGNOSIS — Y92009 Unspecified place in unspecified non-institutional (private) residence as the place of occurrence of the external cause: Secondary | ICD-10-CM | POA: Diagnosis not present

## 2015-01-10 DIAGNOSIS — Z888 Allergy status to other drugs, medicaments and biological substances status: Secondary | ICD-10-CM

## 2015-01-10 DIAGNOSIS — Z885 Allergy status to narcotic agent status: Secondary | ICD-10-CM

## 2015-01-10 DIAGNOSIS — J9602 Acute respiratory failure with hypercapnia: Secondary | ICD-10-CM | POA: Diagnosis present

## 2015-01-10 LAB — I-STAT ARTERIAL BLOOD GAS, ED
Acid-base deficit: 19 mmol/L — ABNORMAL HIGH (ref 0.0–2.0)
Bicarbonate: 13 mEq/L — ABNORMAL LOW (ref 20.0–24.0)
O2 Saturation: 97 %
PCO2 ART: 56 mmHg — AB (ref 35.0–45.0)
PH ART: 6.975 — AB (ref 7.350–7.450)
TCO2: 15 mmol/L (ref 0–100)
pO2, Arterial: 145 mmHg — ABNORMAL HIGH (ref 80.0–100.0)

## 2015-01-10 LAB — CBC WITH DIFFERENTIAL/PLATELET
BASOS ABS: 0 10*3/uL (ref 0.0–0.1)
Basophils Relative: 0 % (ref 0–1)
Eosinophils Absolute: 0.5 10*3/uL (ref 0.0–0.7)
Eosinophils Relative: 2 % (ref 0–5)
HCT: 45.7 % (ref 39.0–52.0)
Hemoglobin: 13.5 g/dL (ref 13.0–17.0)
LYMPHS ABS: 6.4 10*3/uL — AB (ref 0.7–4.0)
Lymphocytes Relative: 28 % (ref 12–46)
MCH: 29.4 pg (ref 26.0–34.0)
MCHC: 29.5 g/dL — ABNORMAL LOW (ref 30.0–36.0)
MCV: 99.6 fL (ref 78.0–100.0)
MONO ABS: 1.6 10*3/uL — AB (ref 0.1–1.0)
Monocytes Relative: 7 % (ref 3–12)
Neutro Abs: 14.4 10*3/uL — ABNORMAL HIGH (ref 1.7–7.7)
Neutrophils Relative %: 63 % (ref 43–77)
Platelets: 203 10*3/uL (ref 150–400)
RBC: 4.59 MIL/uL (ref 4.22–5.81)
RDW: 13.9 % (ref 11.5–15.5)
WBC: 22.9 10*3/uL — ABNORMAL HIGH (ref 4.0–10.5)

## 2015-01-10 LAB — I-STAT TROPONIN, ED: TROPONIN I, POC: 0.54 ng/mL — AB (ref 0.00–0.08)

## 2015-01-10 LAB — URINALYSIS, ROUTINE W REFLEX MICROSCOPIC
BILIRUBIN URINE: NEGATIVE
Glucose, UA: NEGATIVE mg/dL
Hgb urine dipstick: NEGATIVE
Ketones, ur: NEGATIVE mg/dL
Leukocytes, UA: NEGATIVE
Nitrite: NEGATIVE
PH: 5.5 (ref 5.0–8.0)
Protein, ur: NEGATIVE mg/dL
Specific Gravity, Urine: 1.023 (ref 1.005–1.030)
UROBILINOGEN UA: 0.2 mg/dL (ref 0.0–1.0)

## 2015-01-10 LAB — I-STAT CHEM 8, ED
BUN: 17 mg/dL (ref 6–23)
CHLORIDE: 105 mmol/L (ref 96–112)
CREATININE: 1.4 mg/dL — AB (ref 0.50–1.35)
Calcium, Ion: 1.02 mmol/L — ABNORMAL LOW (ref 1.13–1.30)
GLUCOSE: 437 mg/dL — AB (ref 70–99)
HEMATOCRIT: 45 % (ref 39.0–52.0)
HEMOGLOBIN: 15.3 g/dL (ref 13.0–17.0)
POTASSIUM: 4.1 mmol/L (ref 3.5–5.1)
Sodium: 138 mmol/L (ref 135–145)
TCO2: 11 mmol/L (ref 0–100)

## 2015-01-10 LAB — COMPREHENSIVE METABOLIC PANEL
ALK PHOS: 83 U/L (ref 39–117)
ALT: 183 U/L — AB (ref 0–53)
ANION GAP: 21 — AB (ref 5–15)
AST: 187 U/L — ABNORMAL HIGH (ref 0–37)
Albumin: 2.3 g/dL — ABNORMAL LOW (ref 3.5–5.2)
BUN: 13 mg/dL (ref 6–23)
CO2: 11 mmol/L — ABNORMAL LOW (ref 19–32)
Calcium: 7.6 mg/dL — ABNORMAL LOW (ref 8.4–10.5)
Chloride: 107 mmol/L (ref 96–112)
Creatinine, Ser: 1.75 mg/dL — ABNORMAL HIGH (ref 0.50–1.35)
GFR calc Af Amer: 43 mL/min — ABNORMAL LOW (ref >90)
GFR calc non Af Amer: 37 mL/min — ABNORMAL LOW (ref >90)
Glucose, Bld: 441 mg/dL — ABNORMAL HIGH (ref 70–99)
POTASSIUM: 4.3 mmol/L (ref 3.5–5.1)
SODIUM: 139 mmol/L (ref 135–145)
TOTAL PROTEIN: 5 g/dL — AB (ref 6.0–8.3)
Total Bilirubin: 0.5 mg/dL (ref 0.3–1.2)

## 2015-01-10 LAB — I-STAT CG4 LACTIC ACID, ED: LACTIC ACID, VENOUS: 13.11 mmol/L — AB (ref 0.5–2.0)

## 2015-01-10 LAB — TROPONIN I: TROPONIN I: 7.7 ng/mL — AB (ref <0.031)

## 2015-01-10 LAB — GLUCOSE, CAPILLARY: GLUCOSE-CAPILLARY: 276 mg/dL — AB (ref 70–99)

## 2015-01-10 LAB — PATHOLOGIST SMEAR REVIEW

## 2015-01-10 LAB — LACTIC ACID, PLASMA: LACTIC ACID, VENOUS: 7.4 mmol/L — AB (ref 0.5–2.0)

## 2015-01-10 MED ORDER — SODIUM CHLORIDE 0.9 % IV SOLN
INTRAVENOUS | Status: DC
  Administered 2015-01-10: 11:00:00 via INTRAVENOUS

## 2015-01-10 MED ORDER — PANTOPRAZOLE SODIUM 40 MG IV SOLR: 40.0000 mg | Freq: Every day | INTRAVENOUS | Status: DC

## 2015-01-10 MED ORDER — MORPHINE BOLUS VIA INFUSION
5.0000 mg | INTRAVENOUS | Status: DC | PRN
  Filled 2015-01-10: qty 20

## 2015-01-10 MED ORDER — MORPHINE SULFATE 25 MG/ML IV SOLN
10.0000 mg/h | INTRAVENOUS | Status: DC
  Administered 2015-01-10: 10 mg/h via INTRAVENOUS
  Filled 2015-01-10: qty 10

## 2015-01-10 MED ORDER — LEVETIRACETAM 500 MG/5ML IV SOLN
500.0000 mg | Freq: Two times a day (BID) | INTRAVENOUS | Status: DC
  Administered 2015-01-10: 500 mg via INTRAVENOUS
  Filled 2015-01-10 (×2): qty 5

## 2015-01-10 MED ORDER — SODIUM CHLORIDE 0.9 % IV SOLN: 250.0000 mL | INTRAVENOUS | Status: DC | PRN

## 2015-01-10 MED ORDER — LORAZEPAM 2 MG/ML IJ SOLN
5.0000 mg | INTRAMUSCULAR | Status: AC
  Administered 2015-01-10: 5 mg via INTRAVENOUS
  Filled 2015-01-10: qty 3

## 2015-01-10 MED ORDER — FENTANYL CITRATE 0.05 MG/ML IJ SOLN
50.0000 ug | Freq: Once | INTRAMUSCULAR | Status: AC
  Administered 2015-01-10: 50 ug via INTRAVENOUS
  Filled 2015-01-10: qty 2

## 2015-01-10 MED ORDER — HEPARIN SODIUM (PORCINE) 5000 UNIT/ML IJ SOLN
5000.0000 [IU] | Freq: Three times a day (TID) | INTRAMUSCULAR | Status: DC
  Administered 2015-01-10: 5000 [IU] via SUBCUTANEOUS
  Filled 2015-01-10: qty 1

## 2015-01-10 MED ORDER — EPINEPHRINE HCL 0.1 MG/ML IJ SOSY
PREFILLED_SYRINGE | INTRAMUSCULAR | Status: AC | PRN
  Administered 2015-01-10: 1 mg via INTRAVENOUS

## 2015-01-10 MED ORDER — SODIUM CHLORIDE 0.9 % IV SOLN
10.0000 mg/h | INTRAVENOUS | Status: DC
  Administered 2015-01-10: 10 mg/h via INTRAVENOUS
  Filled 2015-01-10 (×2): qty 10

## 2015-01-10 MED ORDER — MIDAZOLAM HCL 2 MG/2ML IJ SOLN
2.0000 mg | INTRAMUSCULAR | Status: DC | PRN
  Administered 2015-01-10 (×2): 2 mg via INTRAVENOUS
  Filled 2015-01-10 (×2): qty 2

## 2015-01-10 MED ORDER — SODIUM CHLORIDE 0.9 % IV SOLN
25.0000 ug/h | INTRAVENOUS | Status: DC
  Administered 2015-01-10: 25 ug/h via INTRAVENOUS
  Filled 2015-01-10: qty 50

## 2015-01-10 MED ORDER — INSULIN ASPART 100 UNIT/ML ~~LOC~~ SOLN: 2.0000 [IU] | SUBCUTANEOUS | Status: DC

## 2015-01-10 MED ORDER — MIDAZOLAM BOLUS VIA INFUSION
5.0000 mg | INTRAVENOUS | Status: DC | PRN
  Filled 2015-01-10: qty 20

## 2015-01-10 MED ORDER — LORAZEPAM 2 MG/ML IJ SOLN: 5.0000 mg | INTRAMUSCULAR | Status: DC

## 2015-01-10 MED ORDER — FENTANYL BOLUS VIA INFUSION
25.0000 ug | INTRAVENOUS | Status: DC | PRN
  Filled 2015-01-10: qty 25

## 2015-01-10 MED FILL — Medication: Qty: 1 | Status: AC

## 2015-01-11 LAB — URINE CULTURE
Colony Count: NO GROWTH
Culture: NO GROWTH
Special Requests: NORMAL

## 2015-01-14 ENCOUNTER — Telehealth: Payer: Self-pay

## 2015-01-14 NOTE — Telephone Encounter (Signed)
[facility] 01/14/15.  Sent via courier to 3100 for signature from Dr. Tyson AliasFeinstein.  Received back 01/16/15 and faxed and called for pick-up.

## 2015-01-16 NOTE — Discharge Summary (Signed)
NAMEBENTLEIGH, Andres Marshall NO.:  [id number]  MEDICAL RECORD NO.:  [id number]  LOCATION:  2M11C                        FACILITY:  [facility]  PHYSICIAN:  Nelda Bucks, MD DATE OF BIRTH:  12-Sep-1941  DATE OF ADMISSION:  2015-01-27 DATE OF DISCHARGE:  2015-01-27                              DISCHARGE SUMMARY   DEATH SUMMARY  This is a 74 year old male who was admitted on 01-27-2015, after a witnessed PEA arrest proceeded by shortness of breath, total down time greater than 1 hour exactly.  Surprisingly enough was stable in the emergency room.  PCM was called to admit the patient.  Essentially, the patient is a retired Company secretary and has had limited functional status to some degree, was changing vehicles in his parking lot, although he normally would get slightly winded with this.  After changing the vehicles, he became acutely short of breath and then went into PEA arrest in front of his lawn, there was no trauma.  His long-time girlfriend called 911 and they responded immediately and performed CPR and ACLS for 1 hour.  The patient was in PEA during that time, return of circulation, and was admitted to the [facility] ER.  There was a history of asthma and sleep apnea.  He was being managed for acute hypoxic hypercarbic respiratory failure.  He was severely acidotic.  He was provided a high minute ventilation on the ventilator machine.  Clearly, we were concerned for pulmonary embolism as the source, although he was deemed medically stable which would be odd to have hemodynamically significant PE that causes cardiac arrest and not be on pressors.  We were concerned about aspiration, concerned about asthma contribution. Doppler of the legs was ordered.  An echo was ordered.  Discussions were held with the family where they clearly did not want to provide heroic measures under these circumstances and knew that his neurological recovery would be abysmal.  No code blue  ordered or escalation of therapy was written.  No line was wished.  No pressors were wished. Troponins were followed.  Lactic acid was elevated.  The patient has acute kidney injury.  Unable to perform CTA of the chest as well as being probably something that remarkably changed his overall outcome regardless of the result.  He had shock liver, multiorgan failure. Essentially, the patient was admitted to the medical surgical intensive care unit and noted to have myoclonus versus seizures, likely myoclonus. Keppra was started.  Benzodiazepines were provided.  EEG was obtained which essentially showed flattened silence with intermittent movements related to the myoclonus.  The family understood the poor prognosis.  Of course, the patient had not been declared brain dead on physical examination with the myoclonic movements.  They opted for comfort care, discontinuation of life support.  The patient was started on morphine and Versed and expired.  FINAL DIAGNOSES UPON DEATH: 1. Status post prolonged cardiac arrest.  Etiology to rule out     pulmonary embolism, asthma exacerbation.  Cardiac primary event     favored. 2. Severe anoxic brain injury with myoclonus. 3. Acute renal failure. 4. Shock liver. 5. Multiorgan dysfunction syndrome.     Nelda Bucks,  MD     DJF/MEDQ  D:  01/15/2015  T:  01/16/2015  Job:  [id number]

## 2015-02-03 NOTE — ED Notes (Signed)
Critical lab results of CG4  given to Dr.Horton.

## 2015-02-03 NOTE — Progress Notes (Signed)
   11/13/2014 1700  Clinical Encounter Type  Visited With Patient and family together;Health care provider  Visit Type Follow-up;Death  Referral From Nurse  Spiritual Encounters  Spiritual Needs Emotional   Chaplain was paged to patient's room at 4:31 PM. Chaplain was notified that the patient had passed away. On Call chaplain visited patient with daytime chaplain. Daytime chaplain prayed with patient's family. On-Call chaplain let the family know that they have as much time as they need to be at bedside and grieve. On-Call chaplain also got the funeral arrangements from a family member and shared it with the patient's nurse. Page Merrilyn Puman-Call chaplain if family needs further assistance this evening.  Cranston NeighborStrother, Nevae Pinnix R, Chaplain  5:39 PM

## 2015-02-03 NOTE — ED Notes (Signed)
Family at bedside. Updated on patient status. Offered comfort measures. Given patients belongings including watch, wallet, cut, clothing.

## 2015-02-03 NOTE — ED Notes (Signed)
Joneen RoachPaul Hoffman, NP critical care at bedside.

## 2015-02-03 NOTE — ED Notes (Signed)
Per girlfriend, patient always has SOB. Was in the driveway, switching out the cars when he became increasingly SOB. Began using inhalers then started vomiting. Then became unresponsive. Girlfriend called EMS. EMs found pt unresponsive in [place], see code documentation for detailed EMS interventions. CPR in progress on arrival to ED.

## 2015-02-03 NOTE — H&P (Signed)
PULMONARY / CRITICAL CARE MEDICINE   Name: Andres Marshall MRN: [id number] DOB: [birth year]    ADMISSION DATE:  2015-01-28 CONSULTATION DATE:  01/28/2015  REFERRING MD :  EDP Horton  CHIEF COMPLAINT:  Arrest  INITIAL PRESENTATION: 74 year old male admitted 4/7 after witnessed PEA arrest preceded by progressive SOB. Total downtime greater than one hour. Hemodynamically stable in ED with minimal support. PCCM to see.  STUDIES:  Echo 4/7 > Dopplers 4/7 >  SIGNIFICANT EVENTS:   HISTORY OF PRESENT ILLNESS:  74 year old male with PMH asthma, HTN, and OSA. He complained of progressive dyspnea on the morning of presentation. Used inhalers without improvement and vomited x 1. He had a witnessed cardiac arrest, EMS arrived 825 AM and found him to be in PEA, initiated ACLS. En route he was given narcan, dextrose, 12 total mg epi, and was intubated. He was still in PEA at arrival to ED at 925. ROSC 933. Unresponsive in ED on vent but requiring no pressors. PCCM to see.   PAST MEDICAL HISTORY :   has a past medical history of SOB (shortness of breath); Asthma; High cholesterol; Seasonal allergies; Hypertension; Sleep apnea; and Pneumonia (1977).  has past surgical history that includes Skin cancer excision (2007) and Colonoscopy with propofol (N/A, 09/13/2014). Prior to Admission medications   Medication Sig Start Date End Date Taking? Authorizing Provider  albuterol (PROVENTIL HFA;VENTOLIN HFA) 108 (90 BASE) MCG/ACT inhaler 2 puffs every 4-6 hours as needed- rescue 04/13/14   Waymon Budge, MD  budesonide (PULMICORT) 0.5 MG/2ML nebulizer solution Take 0.5 mg by nebulization 2 (two) times daily.    Historical Provider, MD  formoterol (PERFOROMIST) 20 MCG/2ML nebulizer solution Take 20 mcg by nebulization 2 (two) times daily.    Historical Provider, MD  levothyroxine (SYNTHROID, LEVOTHROID) 150 MCG tablet Take 1 tablet by mouth daily before breakfast.  04/12/14   Historical Provider, MD  simvastatin  (ZOCOR) 40 MG tablet Take 1 tablet by mouth every evening.  02/08/14   Historical Provider, MD   Allergies  Allergen Reactions  . Theophyllines     Sees things  . Codeine Other (See Comments)    Sees things  . Mucinex Dm [Dm-Guaifenesin Er] Other (See Comments)    Feels drunk    FAMILY HISTORY:  has no family status information on file.  SOCIAL HISTORY:  reports that he quit smoking about 39 years ago. His smoking use included Cigarettes. He has a 30 pack-year smoking history. He has never used smokeless tobacco. He reports that he does not drink alcohol or use illicit drugs.  REVIEW OF SYSTEMS:  unable  SUBJECTIVE:   VITAL SIGNS: Pulse Rate:  [83] 83 (04/07 0949) Resp:  [18] 18 (04/07 0949) BP: (124-143)/(60-62) 124/60 mmHg (04/07 0949) SpO2:  [77 %-78 %] 78 % (04/07 0949) FiO2 (%):  [100 %] 100 % (04/07 0940) HEMODYNAMICS:   VENTILATOR SETTINGS: Vent Mode:  [-] PRVC FiO2 (%):  [100 %] 100 % Set Rate:  [12 bmp] 12 bmp Vt Set:  [500 mL] 500 mL PEEP:  [8 cmH20] 8 cmH20 INTAKE / OUTPUT:  Intake/Output Summary (Last 24 hours) at 01/28/15 1013 Last data filed at 01/28/15 0942  Gross per 24 hour  Intake      0 ml  Output     50 ml  Net    -50 ml    PHYSICAL EXAMINATION: General:  Morbidly obese male Neuro:  Obtunded, - cough, - gag, - corneal reflex HEENT:  [place]/AT, pupils minimally responsive, sluggish. Cardiovascular:  irreg irreg no MRG Lungs:  Coarse breath sounds Abdomen:  Soft, non-distended, obese Musculoskeletal:  No acute deformity Skin:  Hyperpigmentation to BLE c/w PVD.   LABS:  CBC  Recent Labs Lab 09-Feb-2015 0942  WBC 22.9*  HGB 13.5  HCT 45.7  PLT 203   Coag's No results for input(s): APTT, INR in the last 168 hours. BMET No results for input(s): NA, K, CL, CO2, BUN, CREATININE, GLUCOSE in the last 168 hours. Electrolytes No results for input(s): CALCIUM, MG, PHOS in the last 168 hours. Sepsis Markers  Recent Labs Lab Feb 09, 2015 0949   LATICACIDVEN 13.11*   ABG No results for input(s): PHART, PCO2ART, PO2ART in the last 168 hours. Liver Enzymes No results for input(s): AST, ALT, ALKPHOS, BILITOT, ALBUMIN in the last 168 hours. Cardiac Enzymes No results for input(s): TROPONINI, PROBNP in the last 168 hours. Glucose No results for input(s): GLUCAP in the last 168 hours.  Imaging No results found.   ASSESSMENT / PLAN:  PULMONARY OETT 4/7 A: Acute hypoxemic/hypercarbic respiratory failure s/p cardiac arrest SOB prior to arrest H/o Asthma, OSA on CPAP ? PE ? Aspiration  P:   Full vent support Follow CXR  Follow ABG VAP bundle Scheduled/PRN nebs Doppler legs Echo assess pericardial effusion/PE  CARDIOVASCULAR A:  PEA arrest Afib H/o HTN  P:  Telemetry monitoring NO CODE BLUE OR ESCALATION No line/pressors Trend troponin Repeat lactic EKG in am Echo to r/o pericardial effusion/R heart strain?PE  RENAL A:   AKI High AG metabolic acidosis - lactic  P:   Follow BMET  GASTROINTESTINAL A:   Transaminitis, suspect shock liver  P:   NPO SUP: IV protonix Follow LFT  HEMATOLOGIC A:   ? PE  P:  CTA limited by renal fxn Dopplers and echo as above Follow CBC  INFECTIOUS A:   Aspiration? Leukocytosis  P:   BCx2 4/7 > Sputum 4/7 > Abx: Unasyn, start date 4/7 PCT algorithm   ENDOCRINE A:   No known issues  P:   CBG monitoring and SSI  NEUROLOGIC A:  Acute encephalopathy s/p arrest Suspect severe anoxic injury  P:   RASS goal: 0 Fentanyl gtt low dose for vent synchrony   FAMILY  - Updates: Discussion with large family, daughter, brother, long time girlfriend. Given extended downtime they are aware of significant risk of severe anoxic injury. Have opted for full DNR at this time. Considering transition to comfort should there be any deterioration. No escalation.  - Inter-disciplinary family meet or Palliative Care meeting due by:  4/14  [name],  AGACNP-BC [facility] Pager [phone] or [phone]  2015/02/09 11:50 AM   STAFF NOTE: Cindi Carbon, MD FACP have personally reviewed patient's available data, including medical history, events of note, physical examination and test results as part of my evaluation. I have discussed with resident/NP and other care providers such as pharmacist, RN and RRT. In addition, I personally evaluated patient and elicited key findings of: he is unresponsive, no corneal's, limited pupil activity, no cough with suctioning, prolonged down time, extensive d/w family, no escalation vent / pressors, way outside window for any temp management, avoid fevers, etiology unclear, but likely cardiac , cardio/pulm, crt 1.74, doppler legs, echo assessment unable to ct chest, unlikely to change outcome, likely will not improve neuro as we correct PH , increasing MV on vent repeat abg in a few hours, i also let family know we could dc  all support for comfort at anytime. Will NOT start any new therapy, pressors etc, likley to progress to brain death after we correct metabolic stressors then re assess  The patient is critically ill with multiple organ systems failure and requires high complexity decision making for assessment and support, frequent evaluation and titration of therapies, application of advanced monitoring technologies and extensive interpretation of multiple databases.   Critical Care Time devoted to patient care services described in this note is35 Minutes. This time reflects time of care of this signee: Rory Percyaniel Feinstein, MD FACP. This critical care time does not reflect procedure time, or teaching time or supervisory time of PA/NP/Med student/Med Resident etc but could involve care discussion time. Rest per NP/medical resident whose note is outlined above and that I agree with   Mcarthur Rossettianiel J. Tyson AliasFeinstein, MD, FACP Pgr: [phone] [facility] 01/15/2015 12:18  PM

## 2015-02-03 NOTE — Progress Notes (Signed)
Dripps wasted in sink with Andres Ehlersyril Forcha, RN: Fentanyl 230 mls, Versed 20 mls,  Morphine 190 mls.

## 2015-02-03 NOTE — Progress Notes (Signed)
Terminal extubation of patient to room air.  NG also removed.

## 2015-02-03 NOTE — ED Provider Notes (Signed)
CSN: [id number]     Arrival date & time Jan 30, 2015  4098 History   First MD Initiated Contact with Patient 01/30/15 [phone]     No chief complaint on file.    (Consider location/radiation/quality/duration/timing/severity/associated sxs/prior Treatment) HPI  Level V caveat for acuity of condition.  This is a 74 year old male with history of COPD, sleep apnea, hypertension who presents with CPR in progress.  Per EMS, patient had a witnessed arrest by his significant other.  EMS was called. He was in PE arrest upon their arrival. He received a total of 12 mg of epinephrine.  No definite Ross in route; however, EMS states that they did note a significant spike in capnography at some point but were unable to obtain pulses. Patient is morbidly obese. CPR began at 8:25 AM. Patient arrives at the hospital at 9:25 AM. Patient is a retired IT sales professional.  Upon arrival to the trauma Bay, patient was transferred to the bed and placed on our monitors and found to be in PA arrest. One more round of CPR was performed with administration of  epinephrine and ROSC achieved.   Patient currently in atrial fibrillation with a rate in the 80s. Initial O2 sats mid 70s with good waveform on capnography. Neurological exam is poor. Pupils are 3 mm and nonreactive. No spontaneous movement. Patient does occasionally have agonal respirations.  Collateral information obtained by the patient's daughter and significant other. Patient noted to have progressive shortness of breath this morning while preparing to go to the beach. He was in his car when he developed acute worsening shortness of breath and vomiting. His significant other called EMS. She was unable to get him out of the car. He fell out of the car and when EMS arrived he was pulseless.   Past Medical History  Diagnosis Date  . SOB (shortness of breath)   . Asthma   . High cholesterol   . Seasonal allergies   . Hypertension     off all bp meds for last 2 months  .  Sleep apnea     does not like cpap  . Pneumonia 1977   Past Surgical History  Procedure Laterality Date  . Skin cancer excision  2007    scalp  . Colonoscopy with propofol N/A 09/13/2014    Procedure: COLONOSCOPY WITH PROPOFOL;  Surgeon: Shirley Friar, MD;  Location: [facility] ENDOSCOPY;  Service: Endoscopy;  Laterality: N/A;   No family history on file. History  Substance Use Topics  . Smoking status: Former Smoker -- 2.00 packs/day for 15 years    Types: Cigarettes    Quit date: 10/06/1975  . Smokeless tobacco: Never Used  . Alcohol Use: No    Review of Systems  Unable to perform ROS: Acuity of condition      Allergies  Theophyllines; Codeine; and Mucinex dm  Home Medications   Prior to Admission medications   Medication Sig Start Date End Date Taking? Authorizing Provider  albuterol (PROVENTIL HFA;VENTOLIN HFA) 108 (90 BASE) MCG/ACT inhaler 2 puffs every 4-6 hours as needed- rescue 04/13/14   Waymon Budge, MD  budesonide (PULMICORT) 0.5 MG/2ML nebulizer solution Take 0.5 mg by nebulization 2 (two) times daily.    Historical Provider, MD  formoterol (PERFOROMIST) 20 MCG/2ML nebulizer solution Take 20 mcg by nebulization 2 (two) times daily.    Historical Provider, MD  levothyroxine (SYNTHROID, LEVOTHROID) 150 MCG tablet Take 1 tablet by mouth daily before breakfast.  04/12/14   Historical Provider, MD  simvastatin (ZOCOR)  40 MG tablet Take 1 tablet by mouth every evening.  02/08/14   Historical Provider, MD   BP 143/62 mmHg  Pulse 83  Resp 18  SpO2 77% Physical Exam  Constitutional:  Morbidly obese, GCS 3  HENT:  Head: Normocephalic and atraumatic.  Frothy brown secretions from NG tube  Eyes:  Pupils 3 mm and nonreactive bilaterally  Cardiovascular:  PEA  Pulmonary/Chest:  Occasional agonal respirations, coarse breath sounds bilaterally with bag ventilation  Abdominal: Soft. Bowel sounds are normal.  Obese  Musculoskeletal: He exhibits edema.  Abrasion and  deformity to the left ankle and foot  Neurological:  GCS 3  Skin: Skin is dry.  Extremities cool to touch  Psychiatric:  Unable to assess  Nursing note and vitals reviewed.   ED Course  Procedures (including critical care time)  Cardiopulmonary Resuscitation (CPR) Procedure Note Directed/Performed by: Shon BatonHORTON, Gonzalo Waymire F I personally directed ancillary staff and/or performed CPR in an effort to regain return of spontaneous circulation and to maintain cardiac, neuro and systemic perfusion.   CRITICAL CARE Performed by: Shon BatonHORTON, Meloney Feld F   Total critical care time: 45 min  Critical care time was exclusive of separately billable procedures and treating other patients.  Critical care was necessary to treat or prevent imminent or life-threatening deterioration.  Critical care was time spent personally by me on the following activities: development of treatment plan with patient and/or surrogate as well as nursing, discussions with consultants, evaluation of patient's response to treatment, examination of patient, obtaining history from patient or surrogate, ordering and performing treatments and interventions, ordering and review of laboratory studies, ordering and review of radiographic studies, pulse oximetry and re-evaluation of patient's condition.  Labs Review Labs Reviewed  CBC WITH DIFFERENTIAL/PLATELET  COMPREHENSIVE METABOLIC PANEL  I-STAT CG4 LACTIC ACID, ED  I-STAT CHEM 8, ED  I-STAT TROPOININ, ED  I-STAT ARTERIAL BLOOD GAS, ED    Imaging Review No results found.   EKG Interpretation   Date/Time:  Thursday January 10 2015 09:36:59 EDT Ventricular Rate:  88 PR Interval:    QRS Duration: 152 QT Interval:  429 QTC Calculation: 519 R Axis:   119 Text Interpretation:  Atrial fibrillation Nonspecific intraventricular  conduction delay Probable inferior infarct, age indeterminate ST depr,  consider ischemia, anterolateral lds No prior for comparison Confirmed by   Paulyne Mooty  MD, Joron Velis ([id number]) on 01/09/2015 9:41:42 AM      MDM   Final diagnoses:  None    Patient presents status post arrest. He has been down for approximately one hour. One more round of CPR one more milligram epinephrine administered with ROSC. Patient does have occasional agonal respirations.  Neurologic status continues to be very poor especially given duration to ROSC.  Discussed with critical care. Patient not a candidate for cooling.    I updated the patient's family. His daughter and significant other as well as neighbors are present at the bedside. Discussed with the family that given his duration of downtime and currently neurologic status, his prognosis is very poor.  Family is not ready to make patient comfort care but will discuss with critical care and other family members when they arrive.    Shon Batonourtney F Savilla Turbyfill, MD 08/21/15 [phone]

## 2015-02-03 NOTE — Procedures (Signed)
Extensive discussion with family. We discussed the poor prognosis and likely poor quality of life. Family has decided to offer full comfort care. They are aware that the patient may be transferred to palliative care floor for continued comfort care needs. They have been fully updated on the process and expectations. Andres Marshall J. Daphney Hopke, MD, FACP Pgr: [phone] [facility]   

## 2015-02-03 NOTE — Code Documentation (Signed)
Per Dr. Tyson AliasFeinstein patient not canidate for cooling. Will be down to see patient in 20 min.

## 2015-02-03 NOTE — Progress Notes (Signed)
STAT EEG completed; results pending. 

## 2015-02-03 NOTE — Code Documentation (Signed)
Pulse found apical, rate 92

## 2015-02-03 NOTE — ED Notes (Signed)
Critical lab results of Troponin reported to Nurse Tobin ChadNikkie.

## 2015-02-03 NOTE — Code Documentation (Signed)
Pulse check- no pulse, PEA 68, CPR resumed.

## 2015-02-03 NOTE — ED Notes (Signed)
Family at bedside. 

## 2015-02-03 NOTE — Code Documentation (Addendum)
CPR started by EMS at 825, witnessed arrest. Initial rhythm PEA, 12 epi, 25mg  D50, 2 Narcan, 8.0ETT, IO RLL. 5mg  albuterol for EMS, 1L Cold saline PTA.

## 2015-02-03 NOTE — Progress Notes (Signed)
Patient started on morphine and versed gtt.  Patient DNR and Withdrawal of life sustaining treatment order set to be initiated.  Family at bedside.  will continue to monitor.

## 2015-02-03 NOTE — Code Documentation (Signed)
Pupils 3, nonreactive bilaterally

## 2015-02-03 NOTE — Progress Notes (Signed)
Responded to page to provide support to patient family who were waiting in ED lobby. 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 Per patient girlfriend - they were preparing to leave for beach when patient became SOB and votiming and arrested. Girl friend then called 911. Per staff EMS arrived at home around  8:25 AM and found patient down an  initiated ACLS. Per ems while in route he was  Intubated and arrived at ED at 925. Patient's daughterGeorganna Skeans( [place] Ann), son-in-law, Brother Best boy( [place]) and daughter in Social workerlaw, alone with friends were escorted to consultation room for EDP medical status update. Family had opportunity to visit with patient.  Doctor explained to family that the patient was very very critical and may not survive. Comfort measures were discussed and patient was made no code. and patient was later  admitted to 2MW11.  I escorted family to unit waiting area and advised unit nurse of their location.   I provided emotional and spiritual support, ministry of presence, hospitality, empathetic listening and promoted information sharing between staff and family. Will pass on to unit Chaplain for continued support.   2014/12/10 0945  Clinical Encounter Type  Visited With Patient and family together;Health care provider  Visit Type Initial;Spiritual support;Critical Care;ED;Trauma  Referral From Nurse  Spiritual Encounters  Spiritual Needs Prayer;Emotional  Stress Factors  Family Stress Factors Exhausted;Family relationships;Health changes;Major life changes

## 2015-02-03 NOTE — Code Documentation (Signed)
Xray at bedside for portable chest 

## 2015-02-03 NOTE — ED Notes (Signed)
Dr. Tyson AliasFeinstein at bedside with patient and family.

## 2015-02-03 NOTE — Procedures (Signed)
ELECTROENCEPHALOGRAM REPORT  Patient: Andres Marshall       Room #: 4V402M11 EEG No. ID: [id number] Age: 74 y.o.        Sex: male Referring Physician: Tyson AliasFEINSTEIN, D Report Date:  01/11/2015        Interpreting Physician: Aline BrochureSTEWART,Geneal Huebert R  History: Andres Marshall is an 74 y.o. male of hypertension, obesity and sleep apnea, admitted following a PEA arrest with down time of review than 60 minutes. Patient has remained unresponsive and is demonstrated myoclonic type movements.  Indications for study:  Assess severity of encephalopathy; rule out seizure activity.  Technique: This is an 18 channel routine scalp EEG performed at the bedside with bipolar and monopolar montages arranged in accordance to the international 10/20 system of electrode placement.   Description: Patient was intubated and on mechanical ventilation at the time of this recording. He was also on IV fentanyl and his temperature was 34.6C. EEG showed a burst suppression pattern with intermittent low to moderate amplitude mixed frequency activity recorded diffusely, except for higher amplitude phase reversing sharp wave activities occurring at the same time in the left anterior temporal region. Brief bursts of cerebral activity were clinically associated with myoclonic jerks involving head and trunk. Photic stimulation was not performed. Cerebral activity was markedly suppressed during intervals between bursts of activity with no cerebral activity detected, including with increased EEG sensitivity.   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 In addition there were dense indicative of intermittent epileptiform activity with epileptogenic focus involving the left anterior temporal region.   Venetia MaxonR Bryceton Hantz M.D. [facility] [phone]

## 2015-02-03 DEATH — deceased

## 2015-08-15 ENCOUNTER — Ambulatory Visit: Payer: Medicare Other | Admitting: Internal Medicine

## 2016-08-04 IMAGING — CR DG CHEST 1V PORT
2 series · 2 of 2 positions shown · non-contrast
Comparison: Portable exam 5144 hours compared to 02/01/2014

CLINICAL DATA: Trauma, arrest, former smoker, hypertension, asthma

EXAM:
PORTABLE CHEST - 1 VIEW

[AP (1 of 2)]
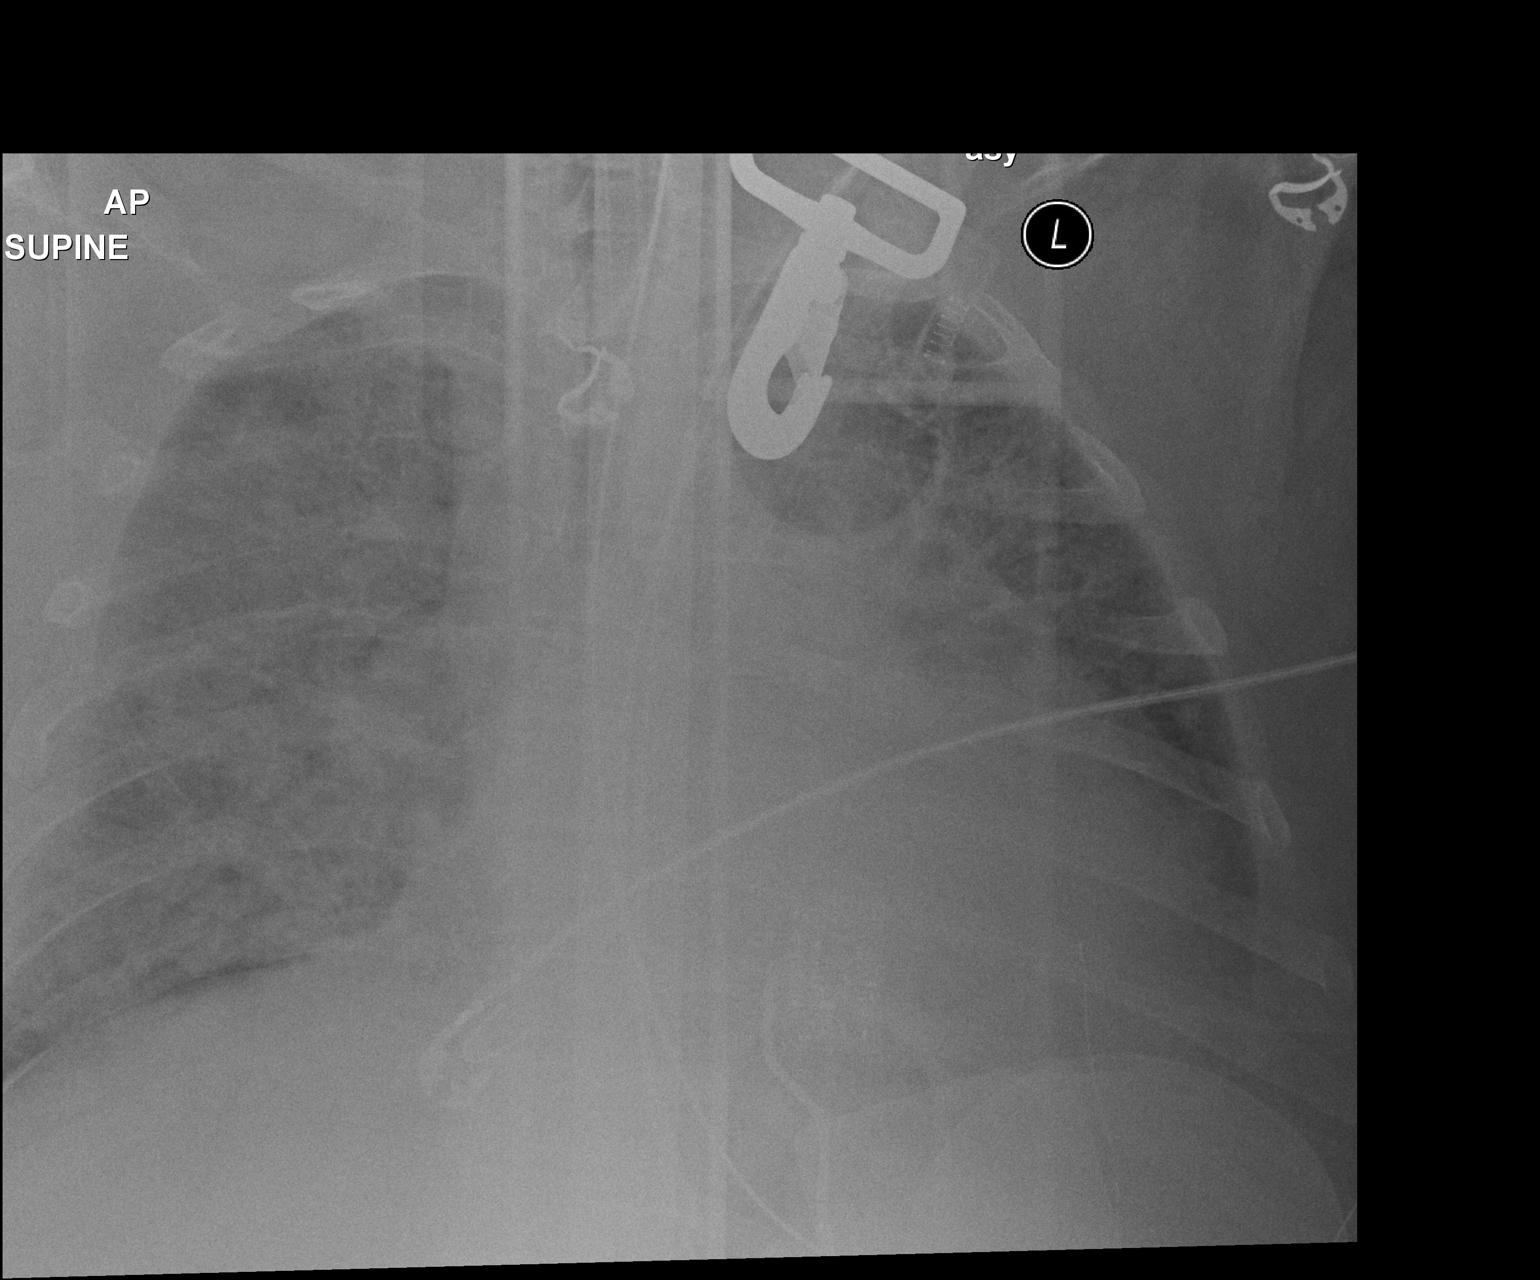

[AP (2 of 2)]
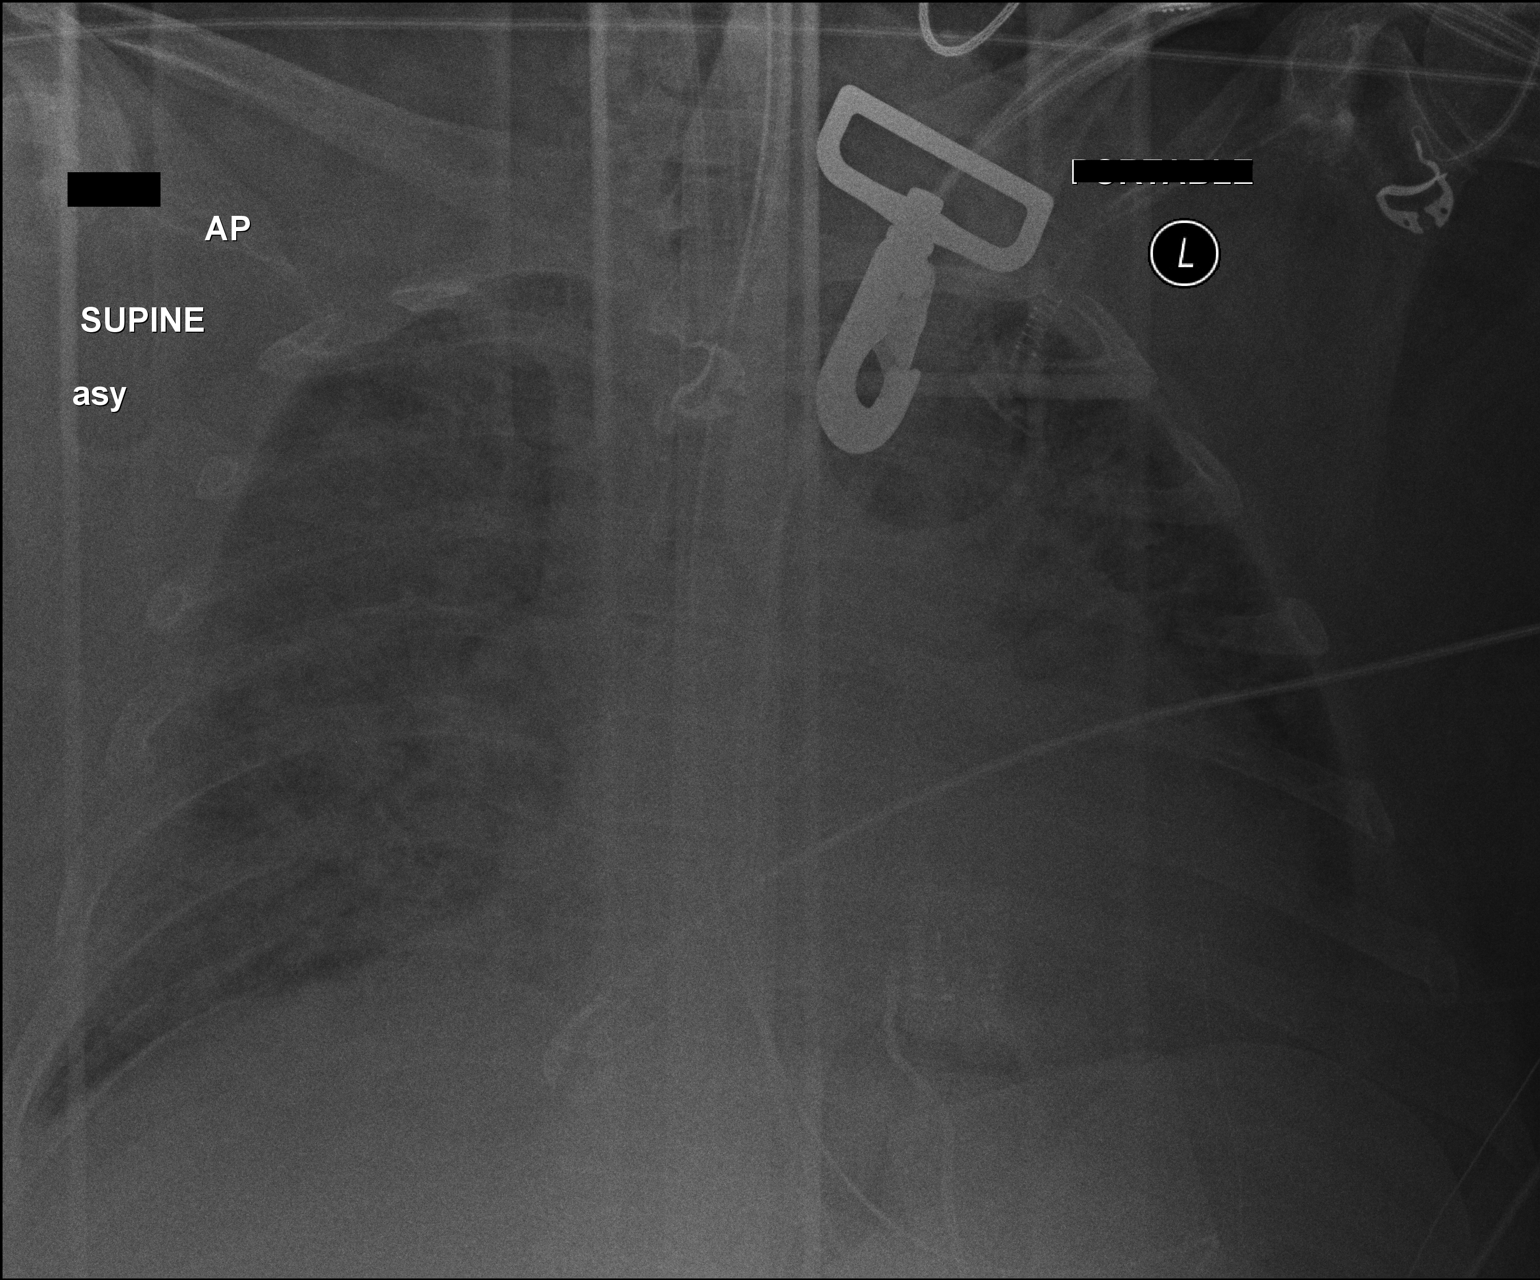

[2 of 2 positions shown; findings below may reference images not displayed]

FINDINGS: Significant artifacts from trauma board and support devices.

Endotracheal tube tip is poorly localized, localized at upper thorax
above carina.

Nasogastric tube extends into stomach.

Enlargement of cardiac silhouette.

Diffuse pulmonary infiltrates bilaterally question pulmonary edema.

No gross pleural effusion or pneumothorax.

No definite fractures identified.
IMPRESSION: Enlargement of cardiac silhouette with diffuse pulmonary infiltrates
question pulmonary edema though pulmonary contusions can have a
similar appearance.
